# Patient Record
Sex: Female | Born: 1975 | Race: White | Hispanic: No | Marital: Married | Smoking: Never smoker
Health system: Southern US, Community
[De-identification: ages and names within clinical notes are randomized; demographics above are authoritative.]

## PROBLEM LIST (undated history)

## (undated) DIAGNOSIS — F419 Anxiety disorder, unspecified: Secondary | ICD-10-CM

## (undated) DIAGNOSIS — E561 Deficiency of vitamin K: Secondary | ICD-10-CM

## (undated) DIAGNOSIS — T7840XA Allergy, unspecified, initial encounter: Secondary | ICD-10-CM

## (undated) DIAGNOSIS — E538 Deficiency of other specified B group vitamins: Secondary | ICD-10-CM

## (undated) DIAGNOSIS — E559 Vitamin D deficiency, unspecified: Secondary | ICD-10-CM

## (undated) DIAGNOSIS — M1990 Unspecified osteoarthritis, unspecified site: Secondary | ICD-10-CM

## (undated) DIAGNOSIS — K589 Irritable bowel syndrome without diarrhea: Secondary | ICD-10-CM

## (undated) DIAGNOSIS — I10 Essential (primary) hypertension: Secondary | ICD-10-CM

## (undated) HISTORY — DX: Irritable bowel syndrome, unspecified: K58.9

## (undated) HISTORY — DX: Unspecified osteoarthritis, unspecified site: M19.90

## (undated) HISTORY — DX: Allergy, unspecified, initial encounter: T78.40XA

## (undated) HISTORY — PX: MIDDLE EAR SURGERY: SHX713

## (undated) HISTORY — DX: Deficiency of other specified B group vitamins: E53.8

## (undated) HISTORY — DX: Essential (primary) hypertension: I10

## (undated) HISTORY — DX: Anxiety disorder, unspecified: F41.9

## (undated) HISTORY — DX: Deficiency of vitamin K: E56.1

## (undated) HISTORY — DX: Vitamin D deficiency, unspecified: E55.9

## (undated) HISTORY — PX: PYLOROPLASTY: SHX418

---
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
## 2001-06-10 ENCOUNTER — Other Ambulatory Visit: Admission: RE | Admit: 2001-06-10 | Discharge: 2001-06-10 | Payer: Self-pay | Admitting: Obstetrics & Gynecology

## 2002-07-16 ENCOUNTER — Other Ambulatory Visit: Admission: RE | Admit: 2002-07-16 | Discharge: 2002-07-16 | Payer: Self-pay | Admitting: Obstetrics & Gynecology

## 2003-04-10 ENCOUNTER — Inpatient Hospital Stay (HOSPITAL_COMMUNITY): Admission: AD | Admit: 2003-04-10 | Discharge: 2003-04-12 | Payer: Self-pay | Admitting: Obstetrics & Gynecology

## 2003-04-13 ENCOUNTER — Encounter: Admission: RE | Admit: 2003-04-13 | Discharge: 2003-05-13 | Payer: Self-pay | Admitting: Obstetrics & Gynecology

## 2003-05-12 ENCOUNTER — Other Ambulatory Visit: Admission: RE | Admit: 2003-05-12 | Discharge: 2003-05-12 | Payer: Self-pay | Admitting: Obstetrics & Gynecology

## 2007-07-07 ENCOUNTER — Inpatient Hospital Stay (HOSPITAL_COMMUNITY): Admission: AD | Admit: 2007-07-07 | Discharge: 2007-07-09 | Payer: Self-pay | Admitting: Obstetrics & Gynecology

## 2010-04-04 ENCOUNTER — Inpatient Hospital Stay (HOSPITAL_COMMUNITY): Admission: AD | Admit: 2010-04-04 | Discharge: 2010-04-04 | Payer: Self-pay | Admitting: Obstetrics & Gynecology

## 2010-04-04 ENCOUNTER — Ambulatory Visit: Payer: Self-pay | Admitting: Advanced Practice Midwife

## 2010-04-05 ENCOUNTER — Ambulatory Visit (HOSPITAL_COMMUNITY): Admission: RE | Admit: 2010-04-05 | Discharge: 2010-04-05 | Payer: Self-pay | Admitting: Obstetrics & Gynecology

## 2010-04-05 ENCOUNTER — Ambulatory Visit: Payer: Self-pay | Admitting: Vascular Surgery

## 2010-04-05 ENCOUNTER — Encounter (INDEPENDENT_AMBULATORY_CARE_PROVIDER_SITE_OTHER): Payer: Self-pay | Admitting: Obstetrics & Gynecology

## 2010-08-14 ENCOUNTER — Inpatient Hospital Stay (HOSPITAL_COMMUNITY)
Admission: AD | Admit: 2010-08-14 | Discharge: 2010-08-16 | Payer: Self-pay | Source: Home / Self Care | Attending: Obstetrics and Gynecology | Admitting: Obstetrics and Gynecology

## 2010-08-17 ENCOUNTER — Inpatient Hospital Stay (HOSPITAL_COMMUNITY): Admission: AD | Admit: 2010-08-17 | Payer: Self-pay | Source: Home / Self Care | Admitting: Obstetrics and Gynecology

## 2010-10-29 LAB — CBC
MCH: 27 pg (ref 26.0–34.0)
MCH: 27.5 pg (ref 26.0–34.0)
Platelets: 197 10*3/uL (ref 150–400)
Platelets: 237 10*3/uL (ref 150–400)
RBC: 4.04 MIL/uL (ref 3.87–5.11)
RBC: 4.32 MIL/uL (ref 3.87–5.11)
WBC: 12.6 10*3/uL — ABNORMAL HIGH (ref 4.0–10.5)

## 2010-10-29 LAB — ABO/RH: ABO/RH(D): O POS

## 2010-10-29 LAB — RPR: RPR Ser Ql: NONREACTIVE

## 2011-01-04 NOTE — Op Note (Signed)
   NAME:  Amber Frank, Amber Frank                        ACCOUNT NO.:  [id number]   MEDICAL RECORD NO.:  [id number]                   PATIENT TYPE:  INP   LOCATION:  9141                                 FACILITY:  [facility]   PHYSICIAN:  Lenoard Aden, M.D.             DATE OF BIRTH:  03/15/1976   DATE OF PROCEDURE:  04/10/2003  DATE OF DISCHARGE:                                 OPERATIVE REPORT   INDICATIONS FOR OPERATIVE DELIVERY:  Fetal tachycardia with nonreassuring  tracing.   POSTOPERATIVE DIAGNOSIS:  Fetal tachycardia with nonreassuring tracing.   PROCEDURE:  Outlet vacuum-assisted vaginal delivery, three-pull Kiwi cup,  one popoff.   SURGEON:  Lenoard Aden, M.D.   ANESTHESIA:  Epidural.   ESTIMATED BLOOD LOSS:  500 cc.   COMPLICATIONS:  None.   DRAINS:  None.   DISPOSITION:  The patient to recovery room in good condition.   DESCRIPTION OF PROCEDURE:  After being apprised of the risks and benefits of  vacuum assistance, which included small incidence of cephalohematoma, scalp  laceration and intracranial hemorrhage, the Kiwi cup was placed (after the  removal of the Foley catheter).  Fetal vertex at LOA less than 45 degrees,  plus 3 station.  Kiwi cup placed in the proper location for three pulls,  with one popoff of a full-term living female over a second-degree midline  laceration.  Agars 9 and 9.  Cord blood collected.  Cord pH collected.  Placenta delivered spontaneously intact.  Bulb suctioning done.   Repair done using a 3-0 repeat in the standard fashion.  No vaginal or  surgical lacerations are noted.  There is a small vulvar hematoma on the  left, which appears stable and has no evidence of expansion on rectal or  vaginal examination.   The patient tolerated the procedure well; mother and baby to recovery room  in good condition.                                               Lenoard Aden, M.D.    RJT/MEDQ  D:  04/10/2003  T:  04/10/2003  Job:   [id number]

## 2011-01-04 NOTE — H&P (Signed)
   NAME:  Amber Frank, Amber Frank                        ACCOUNT NO.:  [id number]   MEDICAL RECORD NO.:  [id number]                   PATIENT TYPE:  INP   LOCATION:  9171                                 FACILITY:  [facility]   PHYSICIAN:  Lenoard Aden, M.D.             DATE OF BIRTH:  12/15/1975   DATE OF ADMISSION:  04/10/2003  DATE OF DISCHARGE:                                HISTORY & PHYSICAL   CHIEF COMPLAINT:  Spontaneous rupture of membranes at 0245 on April 10, 2003.   HISTORY OF PRESENT ILLNESS:  The patient is a 35 year old white female, G2,  P0, EDD of April 12, 2003, at 39+ weeks with unexplained AFP elevation  scheduled for induction today but presented with spontaneous rupture of  membranes this a.m., as noted.   PAST OBSTETRIC HISTORY:  TAB in 1996.   PAST MEDICAL HISTORY:  1. Migraine headaches.  2. Hernia repair.  3. Middle ear reconstruction.  4. Tonsillectomy.  5. Wisdom tooth removal.   FAMILY HISTORY:  Thyroid disease and diabetes.   PRENATAL LABORATORY DATA:  Blood type O positive.  Rh antibody negative.  Rubella immune.  Hepatitis and HIV negative.   PHYSICAL EXAMINATION:  GENERAL:  She is a well-developed, well-nourished,  white female in no acute distress.  HEENT:  Normal.  LUNGS:  Clear.  HEART:  Regular rhythm.  ABDOMEN:  Soft, gravid, and nontender.  Estimated fetal weight 7.5 9 pounds.  PELVIC:  Cervix is 4-5 cm, 90% vertex, 0, slightly posterior.  EXTREMITIES:  Feel no cords.  NEUROLOGIC:  Nonfocal.   IMPRESSION:  1. A 39 week intrauterine pregnancy.  2. Unexplained AFP elevation.  3. Spontaneous rupture of membranes with active labor.    PLAN:  1. Proceed with Pitocin augmentation as needed.  2. Anticipate attempts at vaginal delivery.                                               Lenoard Aden, M.D.    RJT/MEDQ  D:  04/10/2003  T:  04/10/2003  Job:  [id number]

## 2011-05-22 ENCOUNTER — Other Ambulatory Visit: Payer: Self-pay | Admitting: Sports Medicine

## 2011-05-22 DIAGNOSIS — S161XXA Strain of muscle, fascia and tendon at neck level, initial encounter: Secondary | ICD-10-CM

## 2011-05-26 ENCOUNTER — Other Ambulatory Visit: Payer: Self-pay

## 2011-05-28 LAB — CBC
HCT: 33.4 — ABNORMAL LOW
HCT: 35.5 — ABNORMAL LOW
Hemoglobin: 11.3 — ABNORMAL LOW
Hemoglobin: 12.3
MCHC: 33.8
MCHC: 34.6
Platelets: 191
Platelets: 219
RBC: 3.78 — ABNORMAL LOW
RBC: 4.08
WBC: 13 — ABNORMAL HIGH

## 2011-05-28 LAB — RPR: RPR Ser Ql: NONREACTIVE

## 2012-05-26 ENCOUNTER — Ambulatory Visit (INDEPENDENT_AMBULATORY_CARE_PROVIDER_SITE_OTHER): Payer: BC Managed Care – PPO | Admitting: Family Medicine

## 2012-05-26 VITALS — BP 126/80 | HR 86 | Temp 98.7°F | Resp 16 | Ht 65.0 in | Wt 141.0 lb

## 2012-05-26 DIAGNOSIS — M546 Pain in thoracic spine: Secondary | ICD-10-CM

## 2012-05-26 DIAGNOSIS — M549 Dorsalgia, unspecified: Secondary | ICD-10-CM

## 2012-05-26 DIAGNOSIS — S29012A Strain of muscle and tendon of back wall of thorax, initial encounter: Secondary | ICD-10-CM

## 2012-05-26 MED ORDER — DICLOFENAC SODIUM 75 MG PO TBEC: 75.0000 mg | DELAYED_RELEASE_TABLET | Freq: Two times a day (BID) | ORAL | Status: DC

## 2012-05-26 MED ORDER — METAXALONE 800 MG PO TABS: ORAL_TABLET | ORAL | Status: DC

## 2012-05-26 MED ORDER — HYDROCODONE-ACETAMINOPHEN 5-500 MG PO TABS: 1.0000 | ORAL_TABLET | ORAL | Status: DC | PRN

## 2012-05-26 NOTE — Progress Notes (Signed)
Subjective: Patient knows of no injury, but over the past couple days been having severe pain just under the age of the left scapula. Cannot sleep at night. 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 She has an 54-month-old child at home.  Objective: Chest clear. The shortness of breath is just from difficulty breathing he didn't eat because it hurts. Her heart was regular without murmurs. She is tender just under the medial edge of the left scapula. Strength is good. Range of motion is fair.  Assessment: Rhomboid strain  Plan: Muscle relaxants, pain medication, and anti-inflammatory medication. If not doing better may need physical therapy.

## 2012-05-26 NOTE — Patient Instructions (Signed)
Use medicines as directed  Ice/heat  Return if worse

## 2014-06-07 ENCOUNTER — Other Ambulatory Visit: Payer: Self-pay | Admitting: Family Medicine

## 2014-06-07 DIAGNOSIS — N6320 Unspecified lump in the left breast, unspecified quadrant: Secondary | ICD-10-CM

## 2014-06-17 ENCOUNTER — Ambulatory Visit
Admission: RE | Admit: 2014-06-17 | Discharge: 2014-06-17 | Disposition: A | Discharge status: Home / Self Care | Payer: BC Managed Care – PPO | Source: Ambulatory Visit | Attending: Family Medicine | Admitting: Family Medicine

## 2014-06-17 DIAGNOSIS — N6320 Unspecified lump in the left breast, unspecified quadrant: Secondary | ICD-10-CM

## 2014-12-12 ENCOUNTER — Ambulatory Visit (INDEPENDENT_AMBULATORY_CARE_PROVIDER_SITE_OTHER): Payer: BC Managed Care – PPO | Admitting: Neurology

## 2014-12-12 ENCOUNTER — Encounter: Payer: Self-pay | Admitting: Neurology

## 2014-12-12 ENCOUNTER — Ambulatory Visit (INDEPENDENT_AMBULATORY_CARE_PROVIDER_SITE_OTHER): Payer: Self-pay | Admitting: Neurology

## 2014-12-12 DIAGNOSIS — R202 Paresthesia of skin: Secondary | ICD-10-CM

## 2014-12-12 DIAGNOSIS — M79602 Pain in left arm: Principal | ICD-10-CM

## 2014-12-12 DIAGNOSIS — M79603 Pain in arm, unspecified: Secondary | ICD-10-CM

## 2014-12-12 DIAGNOSIS — M79601 Pain in right arm: Secondary | ICD-10-CM

## 2014-12-12 NOTE — Procedures (Signed)
     HISTORY:  Amber Frank is a 39 year old patient with a two-year history of paresthesias of the hands associated with pain. She will have numbness at night in both hands, right greater than left. She does report some mild neck discomfort. She is being evaluated for a possible neuropathy or cervical radiculopathy.  NERVE CONDUCTION STUDIES:  Nerve conduction studies were performed on both upper extremities. The distal motor latencies and motor amplitudes for the median and ulnar nerves were within normal limits. The F wave latencies and nerve conduction velocities for these nerves were also normal. The sensory latencies for the median and ulnar nerves were normal.   EMG STUDIES:  EMG study was performed on the right upper extremity:  The first dorsal interosseous muscle reveals 2 to 4 K units with full recruitment. No fibrillations or positive waves were noted. The abductor pollicis brevis muscle reveals 2 to 4 K units with full recruitment. No fibrillations or positive waves were noted. The extensor indicis proprius muscle reveals 1 to 3 K units with full recruitment. No fibrillations or positive waves were noted. The pronator teres muscle reveals 2 to 3 K units with full recruitment. No fibrillations or positive waves were noted. The biceps muscle reveals 1 to 2 K units with full recruitment. No fibrillations or positive waves were noted. The triceps muscle reveals 2 to 4 K units with full recruitment. No fibrillations or positive waves were noted. The anterior deltoid muscle reveals 2 to 3 K units with full recruitment. No fibrillations or positive waves were noted. The cervical paraspinal muscles were tested at 2 levels. No abnormalities of insertional activity were seen at either level tested. There was good relaxation.  EMG study was performed on the left upper extremity:  The first dorsal interosseous muscle reveals 2 to 4 K units with full recruitment. No fibrillations or  positive waves were noted. The abductor pollicis brevis muscle reveals 2 to 4 K units with full recruitment. No fibrillations or positive waves were noted. The extensor indicis proprius muscle reveals 1 to 3 K units with full recruitment. No fibrillations or positive waves were noted. The pronator teres muscle reveals 2 to 3 K units with full recruitment. No fibrillations or positive waves were noted. The biceps muscle reveals 1 to 2 K units with full recruitment. No fibrillations or positive waves were noted. The triceps muscle reveals 2 to 4 K units with full recruitment. No fibrillations or positive waves were noted. The anterior deltoid muscle reveals 2 to 3 K units with full recruitment. No fibrillations or positive waves were noted. The cervical paraspinal muscles were not tested.   IMPRESSION:  Nerve conduction studies done on both upper extremities were within normal limits. No evidence of carpal tunnel syndrome is seen on either side. EMG evaluation of both upper extremities is unremarkable, without evidence of an overlying cervical radiculopathy on either side.  Jill Alexanders MD 12/12/2014 10:53 AM  [facility] [address] [place], [place] [address]  Phone [phone] Fax [phone]

## 2014-12-12 NOTE — Progress Notes (Signed)
Please refer to EMG and nerve conduction study procedure note. 

## 2016-02-29 ENCOUNTER — Ambulatory Visit
Admission: RE | Admit: 2016-02-29 | Discharge: 2016-02-29 | Disposition: A | Discharge status: Home / Self Care | Payer: BC Managed Care – PPO | Source: Ambulatory Visit | Attending: Family Medicine | Admitting: Family Medicine

## 2016-02-29 ENCOUNTER — Other Ambulatory Visit: Payer: Self-pay | Admitting: Family Medicine

## 2016-02-29 DIAGNOSIS — M5412 Radiculopathy, cervical region: Secondary | ICD-10-CM

## 2016-06-26 ENCOUNTER — Encounter: Payer: Self-pay | Admitting: Gastroenterology

## 2016-07-08 ENCOUNTER — Encounter: Payer: Self-pay | Admitting: Gastroenterology

## 2016-07-08 ENCOUNTER — Ambulatory Visit (INDEPENDENT_AMBULATORY_CARE_PROVIDER_SITE_OTHER): Payer: BC Managed Care – PPO | Admitting: Gastroenterology

## 2016-07-08 VITALS — BP 120/60 | HR 78 | Ht 65.0 in | Wt 185.0 lb

## 2016-07-08 DIAGNOSIS — K625 Hemorrhage of anus and rectum: Secondary | ICD-10-CM

## 2016-07-08 DIAGNOSIS — K529 Noninfective gastroenteritis and colitis, unspecified: Secondary | ICD-10-CM

## 2016-07-08 DIAGNOSIS — Z800 Family history of malignant neoplasm of digestive organs: Secondary | ICD-10-CM

## 2016-07-08 NOTE — Patient Instructions (Signed)

## 2016-07-08 NOTE — Progress Notes (Addendum)
07/08/2016 Amber Frank [id number] 08-20-1975   HISTORY OF PRESENT ILLNESS:  This is a pleasant 40 year old female who is new to our practice.  Says that she's had diarrhea for several years but it seems to have worsened over time.  Says that she if she sticks to a strict diet then she does much better.  Says that the only things that she can eat are rice, meat, cheese, and breads.  Almost all vegetables and fruits give her diarrhea.  Says that when she strays from that diet then she has urgent diarrhea sometimes 10 times per day.  Has had incontinence.  Has seen moderate amounts of blood in her stool at times as well.  No abdominal pain/cramping between episodes of diarrhea or when she is following her diet restrictions.  Was given a diagnosis of IBS-D by her PCP.  She says that she is fine with that diagnosis and will manage and deal with it but wants to make sure that there is not something else causing her symptoms.  She has a family history of Crohn's disease in an aunt and cousin.  Also her father was diagnosed with and died from colon cancer at age 33.  Says that her PCP check her thyroid and celiac labs (says that celiac labs were negative) so we will get those records.  Referred here by her PCP, Dr. Darron Doom.   Past Medical History:  Diagnosis Date  . Anxiety   . Hypertension   . IBS (irritable bowel syndrome)    with diarrhea  . Vitamin B12 deficiency   . Vitamin D deficiency   . Vitamin K deficiency    Past Surgical History:  Procedure Laterality Date  . [place]    reports that she has never smoked. She has never used smokeless tobacco. She reports that she drinks alcohol. She reports that she does not use drugs. family history is not on file. Allergies  Allergen Reactions  . Erythromycin Nausea And Vomiting    Severe stomach cramps.  . Other     Nuts- throat swells up  . Zyrtec [Cetirizine] Other (See Comments)    Makes her feel "loopy"  .  Penicillins Rash      Outpatient Encounter Prescriptions as of 07/08/2016  Medication Sig  . escitalopram (LEXAPRO) 10 MG tablet Take 10 mg by mouth daily. 10-15 mg every day  . lisinopril-hydrochlorothiazide (PRINZIDE,ZESTORETIC) 10-12.5 MG tablet Take 1 tablet by mouth daily.  . mometasone (NASONEX) 50 MCG/ACT nasal spray Place 2 sprays into the nose as needed.  . [DISCONTINUED] cetirizine (ZYRTEC) 10 MG tablet Take 10 mg by mouth as needed.  . [DISCONTINUED] diclofenac (VOLTAREN) 75 MG EC tablet Take 1 tablet (75 mg total) by mouth 2 (two) times daily.  . [DISCONTINUED] HYDROcodone-acetaminophen (VICODIN) 5-500 MG per tablet Take 1 tablet by mouth every 4 (four) hours as needed for pain.  . [DISCONTINUED] metaxalone (SKELAXIN) 800 MG tablet Use 3 times daily as needed for muscle relaxant   No facility-administered encounter medications on file as of 07/08/2016.      REVIEW OF SYSTEMS  : All other systems reviewed and negative except where noted in the History of Present Illness.   PHYSICAL EXAM: BP 120/60   Pulse 78   Ht 5\' 5"  (1.651 m)   Wt 185 lb (83.9 kg)   BMI 30.79 kg/m  General: Well developed white female in no acute distress Head: Normocephalic and atraumatic Eyes:  Sclerae anicteric, conjunctiva pink. Ears: Normal auditory acuity Lungs: Clear throughout to auscultation Heart: Regular rate and rhythm Abdomen: Soft, non-distended.  Normal bowel sounds.  Non-tender. Rectal:  Will be done at the time of colonoscopy. Musculoskeletal: Symmetrical with no gross deformities  Skin: No lesions on visible extremities Extremities: No edema  Neurological: Alert oriented x 4, grossly non-focal Psychological:  Alert and cooperative. Normal mood and affect  ASSESSMENT AND PLAN: -40 year old female with worsening diarrhea over the years, presumed IBS-D, but also rectal bleeding as well. -Family history of colon cancer in her father who was diagnosed and passed away at age  24.  *Will schedule for colonoscopy with Dr. Hilarie Fredrickson.  The risks, benefits, and alternatives to colonoscopy were discussed with the patient and she consents to proceed.  *If colonoscopy is negative then would consider Viberzi 100 mg BID. *Will get lab records from her PCP.  CC:  Hayden Rasmussen, MD   Addendum: Reviewed and agree with initial management. Jerene Bears, MD

## 2016-07-09 ENCOUNTER — Encounter: Payer: Self-pay | Admitting: Internal Medicine

## 2016-07-09 ENCOUNTER — Ambulatory Visit (AMBULATORY_SURGERY_CENTER): Payer: BC Managed Care – PPO | Admitting: Internal Medicine

## 2016-07-09 VITALS — BP 124/74 | HR 67 | Temp 95.5°F | Resp 9 | Ht 65.0 in | Wt 185.0 lb

## 2016-07-09 DIAGNOSIS — D125 Benign neoplasm of sigmoid colon: Secondary | ICD-10-CM

## 2016-07-09 DIAGNOSIS — K635 Polyp of colon: Secondary | ICD-10-CM

## 2016-07-09 DIAGNOSIS — K529 Noninfective gastroenteritis and colitis, unspecified: Secondary | ICD-10-CM

## 2016-07-09 DIAGNOSIS — Z800 Family history of malignant neoplasm of digestive organs: Secondary | ICD-10-CM

## 2016-07-09 DIAGNOSIS — D120 Benign neoplasm of cecum: Secondary | ICD-10-CM

## 2016-07-09 MED ORDER — SODIUM CHLORIDE 0.9 % IV SOLN: 500.0000 mL | INTRAVENOUS | Status: DC

## 2016-07-09 NOTE — Progress Notes (Signed)
Called to room to assist during endoscopic procedure.  Patient ID and intended procedure confirmed with present staff. Received instructions for my participation in the procedure from the performing physician.  

## 2016-07-09 NOTE — Patient Instructions (Signed)
YOU HAD AN ENDOSCOPIC PROCEDURE TODAY AT THE [facility]:   Refer to the procedure report that was given to you for any specific questions about what was found during the examination.  If the procedure report does not answer your questions, please call your gastroenterologist to clarify.  If you requested that your care partner not be given the details of your procedure findings, then the procedure report has been included in a sealed envelope for you to review at your convenience later.  YOU SHOULD EXPECT: Some feelings of bloating in the abdomen. Passage of more gas than usual.  Walking can help get rid of the air that was put into your GI tract during the procedure and reduce the bloating. If you had a lower endoscopy (such as a colonoscopy or flexible sigmoidoscopy) you may notice spotting of blood in your stool or on the toilet paper. If you underwent a bowel prep for your procedure, you may not have a normal bowel movement for a few days.  Please Note:  You might notice some irritation and congestion in your nose or some drainage.  This is from the oxygen used during your procedure.  There is no need for concern and it should clear up in a day or so.  SYMPTOMS TO REPORT IMMEDIATELY:   Following lower endoscopy (colonoscopy or flexible sigmoidoscopy):  Excessive amounts of blood in the stool  Significant tenderness or worsening of abdominal pains  Swelling of the abdomen that is new, acute  Fever of 100F or higher    For urgent or emergent issues, a gastroenterologist can be reached at any hour by calling [phone].   DIET:  We do recommend a small meal at first, but then you may proceed to your regular diet.  Drink plenty of fluids but you should avoid alcoholic beverages for 24 hours.  ACTIVITY:  You should plan to take it easy for the rest of today and you should NOT DRIVE or use heavy machinery until tomorrow (because of the sedation medicines used during the test).     FOLLOW UP: Our staff will call the number listed on your records the next business day following your procedure to check on you and address any questions or concerns that you may have regarding the information given to you following your procedure. If we do not reach you, we will leave a message.  However, if you are feeling well and you are not experiencing any problems, there is no need to return our call.  We will assume that you have returned to your regular daily activities without incident.  If any biopsies were taken you will be contacted by phone or by letter within the next 1-3 weeks.  Please call us at [phone] if you have not heard about the biopsies in 3 weeks.    SIGNATURES/CONFIDENTIALITY: You and/or your care partner have signed paperwork which will be entered into your electronic medical record.  These signatures attest to the fact that that the information above on your After Visit Summary has been reviewed and is understood.  Full responsibility of the confidentiality of this discharge information lies with you and/or your care-partner  Polyp and hemorrhoid information given.. 

## 2016-07-09 NOTE — Op Note (Signed)
[facility] Patient Name: Amber Frank Procedure Date: 07/09/2016 8:54 AM MRN: [id number] Endoscopist: Jerene Bears , MD Age: 40 Referring MD:  Date of Birth: May 14, 1976 Gender: Female Account #: [id number] Procedure:                Colonoscopy Indications:              Chronic diarrhea, family history of colon cancer in                            father at age 64, intermittent rectal bleeding Medicines:                Monitored Anesthesia Care Procedure:                Pre-Anesthesia Assessment:                           - Prior to the procedure, a History and Physical                            was performed, and patient medications and                            allergies were reviewed. The patient's tolerance of                            previous anesthesia was also reviewed. The risks                            and benefits of the procedure and the sedation                            options and risks were discussed with the patient.                            All questions were answered, and informed consent                            was obtained. Prior Anticoagulants: The patient has                            taken no previous anticoagulant or antiplatelet                            agents. ASA Grade Assessment: II - A patient with                            mild systemic disease. After reviewing the risks                            and benefits, the patient was deemed in                            satisfactory condition to undergo the procedure.  After obtaining informed consent, the colonoscope                            was passed under direct vision. Throughout the                            procedure, the patient's blood pressure, pulse, and                            oxygen saturations were monitored continuously. The                            Model PCF-H190DL [id number]) scope was introduced                            through  the anus and advanced to the the terminal                            ileum. The colonoscopy was performed without                            difficulty. The patient tolerated the procedure                            well. The quality of the bowel preparation was                            excellent. The terminal ileum, ileocecal valve,                            appendiceal orifice, and rectum were photographed. Scope In: 9:04:34 AM Scope Out: 9:18:12 AM Scope Withdrawal Time: 0 hours 11 minutes 22 seconds  Total Procedure Duration: 0 hours 13 minutes 38 seconds  Findings:                 The perianal and digital rectal examinations were                            normal.                           The terminal ileum appeared normal.                           A 5 mm polyp was found in the cecum. The polyp was                            sessile. The polyp was removed with a cold snare.                            Resection and retrieval were complete.                           A 4 mm polyp was found in the sigmoid colon. The  polyp was sessile. The polyp was removed with a                            cold snare. Resection and retrieval were complete.                           Normal mucosa was found in the entire colon (other                            than the previously mentioned polyps). Biopsies for                            histology were taken with a cold forceps from the                            right colon and left colon for evaluation of                            microscopic colitis.                           Internal hemorrhoids were found during                            retroflexion. The hemorrhoids were small. Complications:            No immediate complications. Estimated Blood Loss:     Estimated blood loss was minimal. Impression:               - The examined portion of the ileum was normal.                           - One 5 mm polyp in the  cecum, removed with a cold                            snare. Resected and retrieved.                           - One 4 mm polyp in the sigmoid colon, removed with                            a cold snare. Resected and retrieved.                           - Otherwise normal mucosa in the entire examined                            colon. Biopsied.                           - Small internal hemorrhoids. Recommendation:           - Patient has a contact number available for  emergencies. The signs and symptoms of potential                            delayed complications were discussed with the                            patient. Return to normal activities tomorrow.                            Written discharge instructions were provided to the                            patient.                           - Resume previous diet.                           - Continue present medications.                           - Await pathology results.                           - Repeat colonoscopy is recommended. The                            colonoscopy date will be determined after pathology                            results from today's exam become available for                            review. Jerene Bears, MD 07/09/2016 9:23:31 AM This report has been signed electronically.

## 2016-07-10 ENCOUNTER — Telehealth: Payer: Self-pay | Admitting: *Deleted

## 2016-07-10 ENCOUNTER — Telehealth: Payer: Self-pay

## 2016-07-10 NOTE — Telephone Encounter (Signed)
No answer, message left for the patient. 

## 2016-07-10 NOTE — Telephone Encounter (Signed)
  Follow up Call-  Call back number 07/09/2016  Post procedure Call Back phone  # [phone]  Permission to leave phone message Yes  Some recent data might be hidden     Patient questions:  Do you have a fever, pain , or abdominal swelling? No. Pain Score  0 *  Have you tolerated food without any problems? Yes.    Have you been able to return to your normal activities? Yes.    Do you have any questions about your discharge instructions: Diet   No. Medications  No. Follow up visit  No.  Do you have questions or concerns about your Care? No.  Actions: * If pain score is 4 or above: No action needed, pain <4.

## 2016-07-16 ENCOUNTER — Encounter: Payer: Self-pay | Admitting: Internal Medicine

## 2017-08-22 ENCOUNTER — Ambulatory Visit
Admission: RE | Admit: 2017-08-22 | Discharge: 2017-08-22 | Disposition: A | Discharge status: Home / Self Care | Payer: BC Managed Care – PPO | Source: Ambulatory Visit | Attending: Family Medicine | Admitting: Family Medicine

## 2017-08-22 ENCOUNTER — Other Ambulatory Visit: Payer: Self-pay | Admitting: Family Medicine

## 2017-08-22 DIAGNOSIS — M542 Cervicalgia: Secondary | ICD-10-CM

## 2018-06-25 ENCOUNTER — Other Ambulatory Visit: Payer: Self-pay | Admitting: Family Medicine

## 2018-06-25 DIAGNOSIS — Z1231 Encounter for screening mammogram for malignant neoplasm of breast: Secondary | ICD-10-CM

## 2018-07-07 ENCOUNTER — Other Ambulatory Visit: Payer: Self-pay | Admitting: Physician Assistant

## 2018-07-07 DIAGNOSIS — H9312 Tinnitus, left ear: Secondary | ICD-10-CM

## 2018-07-07 DIAGNOSIS — H905 Unspecified sensorineural hearing loss: Secondary | ICD-10-CM

## 2018-07-07 DIAGNOSIS — H903 Sensorineural hearing loss, bilateral: Secondary | ICD-10-CM

## 2018-07-07 DIAGNOSIS — R42 Dizziness and giddiness: Secondary | ICD-10-CM

## 2018-07-27 ENCOUNTER — Ambulatory Visit
Admission: RE | Admit: 2018-07-27 | Discharge: 2018-07-27 | Disposition: A | Discharge status: Home / Self Care | Payer: BC Managed Care – PPO | Source: Ambulatory Visit | Attending: Physician Assistant | Admitting: Physician Assistant

## 2018-07-27 DIAGNOSIS — R42 Dizziness and giddiness: Secondary | ICD-10-CM

## 2018-07-27 DIAGNOSIS — H903 Sensorineural hearing loss, bilateral: Secondary | ICD-10-CM

## 2018-07-27 DIAGNOSIS — H9312 Tinnitus, left ear: Secondary | ICD-10-CM

## 2018-07-27 DIAGNOSIS — H905 Unspecified sensorineural hearing loss: Secondary | ICD-10-CM

## 2018-07-27 MED ORDER — GADOBENATE DIMEGLUMINE 529 MG/ML IV SOLN
17.0000 mL | Freq: Once | INTRAVENOUS | Status: AC | PRN
  Administered 2018-07-27: 17 mL via INTRAVENOUS

## 2018-09-29 ENCOUNTER — Ambulatory Visit
Admission: RE | Admit: 2018-09-29 | Discharge: 2018-09-29 | Disposition: A | Discharge status: Home / Self Care | Payer: BC Managed Care – PPO | Source: Ambulatory Visit | Attending: Family Medicine | Admitting: Family Medicine

## 2018-09-29 DIAGNOSIS — Z1231 Encounter for screening mammogram for malignant neoplasm of breast: Secondary | ICD-10-CM

## 2019-10-07 ENCOUNTER — Other Ambulatory Visit: Payer: Self-pay | Admitting: Family Medicine

## 2019-10-07 DIAGNOSIS — Z1231 Encounter for screening mammogram for malignant neoplasm of breast: Secondary | ICD-10-CM

## 2019-10-11 ENCOUNTER — Other Ambulatory Visit: Payer: Self-pay

## 2019-10-11 ENCOUNTER — Ambulatory Visit
Admission: RE | Admit: 2019-10-11 | Discharge: 2019-10-11 | Disposition: A | Discharge status: Home / Self Care | Payer: BC Managed Care – PPO | Source: Ambulatory Visit

## 2019-10-11 DIAGNOSIS — Z1231 Encounter for screening mammogram for malignant neoplasm of breast: Secondary | ICD-10-CM

## 2020-10-11 DIAGNOSIS — Z1231 Encounter for screening mammogram for malignant neoplasm of breast: Secondary | ICD-10-CM

## 2020-10-13 ENCOUNTER — Ambulatory Visit (HOSPITAL_COMMUNITY)
Admission: RE | Admit: 2020-10-13 | Discharge: 2020-10-13 | Disposition: A | Discharge status: Home / Self Care | Payer: BC Managed Care – PPO | Source: Ambulatory Visit | Attending: Family Medicine | Admitting: Family Medicine

## 2020-10-13 ENCOUNTER — Other Ambulatory Visit: Payer: Self-pay

## 2020-10-13 ENCOUNTER — Other Ambulatory Visit (HOSPITAL_COMMUNITY): Payer: Self-pay | Admitting: Family Medicine

## 2020-10-13 ENCOUNTER — Encounter (HOSPITAL_COMMUNITY): Payer: Self-pay

## 2020-10-13 DIAGNOSIS — Z1231 Encounter for screening mammogram for malignant neoplasm of breast: Secondary | ICD-10-CM

## 2021-05-01 ENCOUNTER — Encounter: Payer: Self-pay | Admitting: Internal Medicine

## 2021-06-29 ENCOUNTER — Encounter: Payer: BC Managed Care – PPO | Admitting: Internal Medicine

## 2021-07-09 ENCOUNTER — Encounter: Payer: BC Managed Care – PPO | Admitting: Internal Medicine

## 2021-07-11 ENCOUNTER — Encounter: Payer: Self-pay | Admitting: Internal Medicine

## 2021-09-12 ENCOUNTER — Other Ambulatory Visit (HOSPITAL_COMMUNITY): Payer: Self-pay | Admitting: Family Medicine

## 2021-09-12 DIAGNOSIS — Z1231 Encounter for screening mammogram for malignant neoplasm of breast: Secondary | ICD-10-CM

## 2021-09-14 ENCOUNTER — Ambulatory Visit (AMBULATORY_SURGERY_CENTER): Payer: BC Managed Care – PPO | Admitting: *Deleted

## 2021-09-14 ENCOUNTER — Other Ambulatory Visit: Payer: Self-pay

## 2021-09-14 VITALS — Ht 65.0 in | Wt 180.0 lb

## 2021-09-14 DIAGNOSIS — Z800 Family history of malignant neoplasm of digestive organs: Secondary | ICD-10-CM

## 2021-09-14 DIAGNOSIS — Z86010 Personal history of colonic polyps: Secondary | ICD-10-CM

## 2021-09-14 MED ORDER — NA SULFATE-K SULFATE-MG SULF 17.5-3.13-1.6 GM/177ML PO SOLN: 1.0000 | Freq: Once | ORAL | 0 refills | Status: AC

## 2021-09-14 NOTE — Progress Notes (Signed)

## 2021-09-25 ENCOUNTER — Encounter: Payer: Self-pay | Admitting: Internal Medicine

## 2021-09-28 ENCOUNTER — Ambulatory Visit (AMBULATORY_SURGERY_CENTER): Payer: BC Managed Care – PPO | Admitting: Internal Medicine

## 2021-09-28 ENCOUNTER — Encounter: Payer: Self-pay | Admitting: Internal Medicine

## 2021-09-28 ENCOUNTER — Other Ambulatory Visit: Payer: Self-pay

## 2021-09-28 VITALS — BP 129/72 | HR 68 | Temp 97.5°F | Resp 12 | Ht 65.0 in | Wt 180.0 lb

## 2021-09-28 DIAGNOSIS — Z86010 Personal history of colonic polyps: Secondary | ICD-10-CM

## 2021-09-28 DIAGNOSIS — Z800 Family history of malignant neoplasm of digestive organs: Secondary | ICD-10-CM | POA: Diagnosis not present

## 2021-09-28 DIAGNOSIS — D125 Benign neoplasm of sigmoid colon: Secondary | ICD-10-CM

## 2021-09-28 MED ORDER — SODIUM CHLORIDE 0.9 % IV SOLN: 500.0000 mL | Freq: Once | INTRAVENOUS | Status: DC

## 2021-09-28 NOTE — Progress Notes (Signed)
No problems noted in the recovery room. maw 

## 2021-09-28 NOTE — Progress Notes (Signed)
Vitals-CW  Pt's states no medical or surgical changes since previsit or office visit. 

## 2021-09-28 NOTE — Progress Notes (Signed)
A/ox3, pleased with MAC, report to RN 

## 2021-09-28 NOTE — Progress Notes (Signed)
GASTROENTEROLOGY PROCEDURE H&P NOTE   Primary Care Physician: Hayden Rasmussen, MD    Reason for Procedure:  Personal history of adenomatous colon polyps and family history of colon cancer in first-degree relative less than 44 years old  Plan:    Colonoscopy  Patient is appropriate for endoscopic procedure(s) in the ambulatory ([place]) setting.  The nature of the procedure, as well as the risks, benefits, and alternatives were carefully and thoroughly reviewed with the patient. Ample time for discussion and questions allowed. The patient understood, was satisfied, and agreed to proceed.     HPI: Amber Frank is a 46 y.o. female who presents for colonoscopy.  Medical history as below.  Tolerated the prep.  No recent chest pain or shortness of breath.  No abdominal pain today.  Past Medical History:  Diagnosis Date   Allergy    SEASONAL   Anxiety    Arthritis    HANDS,KNECK   Hypertension    IBS (irritable bowel syndrome)    with diarrhea   Vitamin B12 deficiency    Vitamin D deficiency    Vitamin K deficiency     Past Surgical History:  Procedure Laterality Date   INGUINAL HERNIA REPAIR  1984   MIDDLE EAR SURGERY Bilateral    1983   PYLOROPLASTY      Prior to Admission medications   Medication Sig Start Date End Date Taking? Authorizing Provider  escitalopram (LEXAPRO) 10 MG tablet Take 10 mg by mouth daily. 10-15 mg every day   Yes [provider]  lisinopril-hydrochlorothiazide (PRINZIDE,ZESTORETIC) 10-12.5 MG tablet Take 1 tablet by mouth daily.   Yes [provider]  Thiamine HCl (VITAMIN B1 PO) Take by mouth daily. TAKE ONE TABLET   Yes [provider]  ALPRAZolam (XANAX) 0.5 MG tablet as needed. 05/13/16   [provider]  Cholecalciferol (VITAMIN D3 PO) Take by mouth daily. TAKE 3000 IU DAILY    [provider]  EPINEPHrine 0.3 mg/0.3 mL IJ SOAJ injection as needed. Patient not taking: Reported on 09/14/2021  02/25/14   [provider]  mometasone (NASONEX) 50 MCG/ACT nasal spray Place 2 sprays into the nose as needed.    [provider]  olopatadine (PATANOL) 0.1 % ophthalmic solution as needed. Patient not taking: Reported on 09/14/2021 02/04/14   [provider]    Current Outpatient Medications  Medication Sig Dispense Refill   escitalopram (LEXAPRO) 10 MG tablet Take 10 mg by mouth daily. 10-15 mg every day     lisinopril-hydrochlorothiazide (PRINZIDE,ZESTORETIC) 10-12.5 MG tablet Take 1 tablet by mouth daily.     Thiamine HCl (VITAMIN B1 PO) Take by mouth daily. TAKE ONE TABLET     ALPRAZolam (XANAX) 0.5 MG tablet as needed.     Cholecalciferol (VITAMIN D3 PO) Take by mouth daily. TAKE 3000 IU DAILY     EPINEPHrine 0.3 mg/0.3 mL IJ SOAJ injection as needed. (Patient not taking: Reported on 09/14/2021)     mometasone (NASONEX) 50 MCG/ACT nasal spray Place 2 sprays into the nose as needed.     olopatadine (PATANOL) 0.1 % ophthalmic solution as needed. (Patient not taking: Reported on 09/14/2021)     Current Facility-Administered Medications  Medication Dose Route Frequency Provider Last Rate Last Admin   0.9 %  sodium chloride infusion  500 mL Intravenous Once Reinhardt Licausi, Lajuan Lines, MD       0.9 %  sodium chloride infusion  500 mL Intravenous Once Necha Harries, Lajuan Lines, MD  Allergies as of 09/28/2021 - Review Complete 09/28/2021  Allergen Reaction Noted   Erythromycin Nausea And Vomiting 05/26/2012   Other  07/08/2016   Zyrtec [cetirizine] Other (See Comments) 07/08/2016   Penicillins Rash 05/26/2012    Family History  Problem Relation Age of Onset   Uterine cancer Mother    Deep vein thrombosis Mother    Irritable bowel syndrome Mother    Breast cancer Mother    Deep vein thrombosis Father    Colon cancer Father 23   Crohn's disease Maternal Aunt    Diverticulitis Paternal Grandmother    Crohn's disease Cousin        mat side   Esophageal cancer Neg Hx     Rectal cancer Neg Hx    Stomach cancer Neg Hx     Social History   Socioeconomic History   Marital status: Married    Spouse name: Not on file   Number of children: 3   Years of education: Not on file   Highest education level: Not on file  Occupational History   Occupation: special ed program specialist  Tobacco Use   Smoking status: Never   Smokeless tobacco: Never  Vaping Use   Vaping Use: Never used  Substance and Sexual Activity   Alcohol use: Yes    Comment: social   Drug use: No   Sexual activity: Not on file  Other Topics Concern   Not on file  Social History Narrative   Not on file   Social Determinants of Health   Financial Resource Strain: Not on file  Food Insecurity: Not on file  Transportation Needs: Not on file  Physical Activity: Not on file  Stress: Not on file  Social Connections: Not on file  Intimate Partner Violence: Not on file    Physical Exam: Vital signs in last 24 hours: @BP  (!) 130/96 (BP Location: Right Arm, Patient Position: Sitting, Cuff Size: Normal)    Pulse 78    Temp (!) 97.5 F (36.4 C) (Temporal)    Ht 5\' 5"  (1.651 m)    Wt 180 lb (81.6 kg)    LMP 09/07/2021    SpO2 99%    BMI 29.95 kg/m  GEN: NAD EYE: Sclerae anicteric ENT: MMM CV: Non-tachycardic Pulm: CTA b/l GI: Soft, NT/ND NEURO:  Alert & Oriented x 3   Zenovia Jarred, MD [facility]  09/28/2021 9:16 AM

## 2021-09-28 NOTE — Op Note (Signed)
[facility] Patient Name: Amber Frank Procedure Date: 09/28/2021 9:18 AM MRN: [id number] Endoscopist: Jerene Bears , MD Age: 46 Referring MD:  Date of Birth: Oct 14, 1975 Gender: Female Account #: [id number] Procedure:                Colonoscopy Indications:              High risk colon cancer surveillance: Personal                            history of non-advanced adenoma, Family history of                            colon cancer in a first-degree relative before age                            45 years, Last colonoscopy: November 2017 Medicines:                Monitored Anesthesia Care Procedure:                Pre-Anesthesia Assessment:                           - Prior to the procedure, a History and Physical                            was performed, and patient medications and                            allergies were reviewed. The patient's tolerance of                            previous anesthesia was also reviewed. The risks                            and benefits of the procedure and the sedation                            options and risks were discussed with the patient.                            All questions were answered, and informed consent                            was obtained. Prior Anticoagulants: The patient has                            taken no previous anticoagulant or antiplatelet                            agents. ASA Grade Assessment: II - A patient with                            mild systemic disease. After reviewing the risks  and benefits, the patient was deemed in                            satisfactory condition to undergo the procedure.                           After obtaining informed consent, the colonoscope                            was passed under direct vision. Throughout the                            procedure, the patient's blood pressure, pulse, and                            oxygen saturations were  monitored continuously. The                            Olympus PCF-H190DL (#[id number]) Colonoscope was                            introduced through the anus and advanced to the                            cecum, identified by appendiceal orifice and                            ileocecal valve. The colonoscopy was performed                            without difficulty. The patient tolerated the                            procedure well. The quality of the bowel                            preparation was good. The ileocecal valve,                            appendiceal orifice, and rectum were photographed. Scope In: 9:23:51 AM Scope Out: 9:33:48 AM Scope Withdrawal Time: 0 hours 7 minutes 1 second  Total Procedure Duration: 0 hours 9 minutes 57 seconds  Findings:                 The digital rectal exam was normal.                           A 5 mm polyp was found in the sigmoid colon. The                            polyp was sessile. The polyp was removed with a                            cold snare. Resection and retrieval were complete.  The exam was otherwise without abnormality on                            direct and retroflexion views. Complications:            No immediate complications. Estimated Blood Loss:     Estimated blood loss was minimal. Impression:               - One 5 mm polyp in the sigmoid colon, removed with                            a cold snare. Resected and retrieved.                           - The examination was otherwise normal on direct                            and retroflexion views. Recommendation:           - Patient has a contact number available for                            emergencies. The signs and symptoms of potential                            delayed complications were discussed with the                            patient. Return to normal activities tomorrow.                            Written discharge instructions were  provided to the                            patient.                           - Resume previous diet.                           - Continue present medications.                           - Await pathology results.                           - Repeat colonoscopy in 5 years for surveillance. Jerene Bears, MD 09/28/2021 9:35:36 AM This report has been signed electronically.

## 2021-09-28 NOTE — Patient Instructions (Addendum)
Handout was given to your care partner on polyps. You may resume your current medications today. Await biopsy results.  May take 1-3 weeks to receive pathology results. Repeat colonoscopy in 5 years for surveillance. Please call if any questions or concerns.      YOU HAD AN ENDOSCOPIC PROCEDURE TODAY AT [facility]:   Refer to the procedure report that was given to you for any specific questions about what was found during the examination.  If the procedure report does not answer your questions, please call your gastroenterologist to clarify.  If you requested that your care partner not be given the details of your procedure findings, then the procedure report has been included in a sealed envelope for you to review at your convenience later.  YOU SHOULD EXPECT: Some feelings of bloating in the abdomen. Passage of more gas than usual.  Walking can help get rid of the air that was put into your GI tract during the procedure and reduce the bloating. If you had a lower endoscopy (such as a colonoscopy or flexible sigmoidoscopy) you may notice spotting of blood in your stool or on the toilet paper. If you underwent a bowel prep for your procedure, you may not have a normal bowel movement for a few days.  Please Note:  You might notice some irritation and congestion in your nose or some drainage.  This is from the oxygen used during your procedure.  There is no need for concern and it should clear up in a day or so.  SYMPTOMS TO REPORT IMMEDIATELY:  Following lower endoscopy (colonoscopy or flexible sigmoidoscopy):  Excessive amounts of blood in the stool  Significant tenderness or worsening of abdominal pains  Swelling of the abdomen that is new, acute  Fever of 100F or higher   For urgent or emergent issues, a gastroenterologist can be reached at any hour by calling [phone]. Do not use MyChart messaging for urgent concerns.    DIET:  We do recommend a small meal at  first, but then you may proceed to your regular diet.  Drink plenty of fluids but you should avoid alcoholic beverages for 24 hours.  ACTIVITY:  You should plan to take it easy for the rest of today and you should NOT DRIVE or use heavy machinery until tomorrow (because of the sedation medicines used during the test).    FOLLOW UP: Our staff will call the number listed on your records 48-72 hours following your procedure to check on you and address any questions or concerns that you may have regarding the information given to you following your procedure. If we do not reach you, we will leave a message.  We will attempt to reach you two times.  During this call, we will ask if you have developed any symptoms of COVID 19. If you develop any symptoms (ie: fever, flu-like symptoms, shortness of breath, cough etc.) before then, please call [phone].  If you test positive for Covid 19 in the 2 weeks post procedure, please call and report this information to [place].    If any biopsies were taken you will be contacted by phone or by letter within the next 1-3 weeks.  Please call us at [phone] if you have not heard about the biopsies in 3 weeks.    SIGNATURES/CONFIDENTIALITY: You and/or your care partner have signed paperwork which will be entered into your electronic medical record.  These signatures attest to the fact that that the information  above on your After Visit Summary has been reviewed and is understood.  Full responsibility of the confidentiality of this discharge information lies with you and/or your care-partner.

## 2021-09-28 NOTE — Progress Notes (Signed)
Called to room to assist during endoscopic procedure.  Patient ID and intended procedure confirmed with present staff. Received instructions for my participation in the procedure from the performing physician.  

## 2021-10-02 ENCOUNTER — Telehealth: Payer: Self-pay

## 2021-10-02 ENCOUNTER — Encounter: Payer: Self-pay | Admitting: Internal Medicine

## 2021-10-02 NOTE — Telephone Encounter (Signed)
°  Follow up Call-  Call back number 09/28/2021  Post procedure Call Back phone  # [phone]  Permission to leave phone message Yes  Some recent data might be hidden     Patient questions:  Do you have a fever, pain , or abdominal swelling? No. Pain Score  0 *  Have you tolerated food without any problems? Yes.    Have you been able to return to your normal activities? Yes.    Do you have any questions about your discharge instructions: Diet   No. Medications  No. Follow up visit  No.  Do you have questions or concerns about your Care? No.  Actions: * If pain score is 4 or above: No action needed, pain <4.

## 2021-10-22 ENCOUNTER — Ambulatory Visit (HOSPITAL_COMMUNITY): Payer: BC Managed Care – PPO

## 2022-01-15 IMAGING — MG MM DIGITAL SCREENING BILAT W/ TOMO AND CAD
8 series · 8 of 24 positions shown · non-contrast
Comparison: Previous exam(s).

CLINICAL DATA: Screening.

EXAM:
DIGITAL SCREENING BILATERAL MAMMOGRAM WITH TOMOSYNTHESIS AND CAD
TECHNIQUE: Bilateral screening digital craniocaudal and mediolateral oblique
mammograms were obtained. Bilateral screening digital breast
tomosynthesis was performed. The images were evaluated with
computer-aided detection.

[R CC synth-2D]
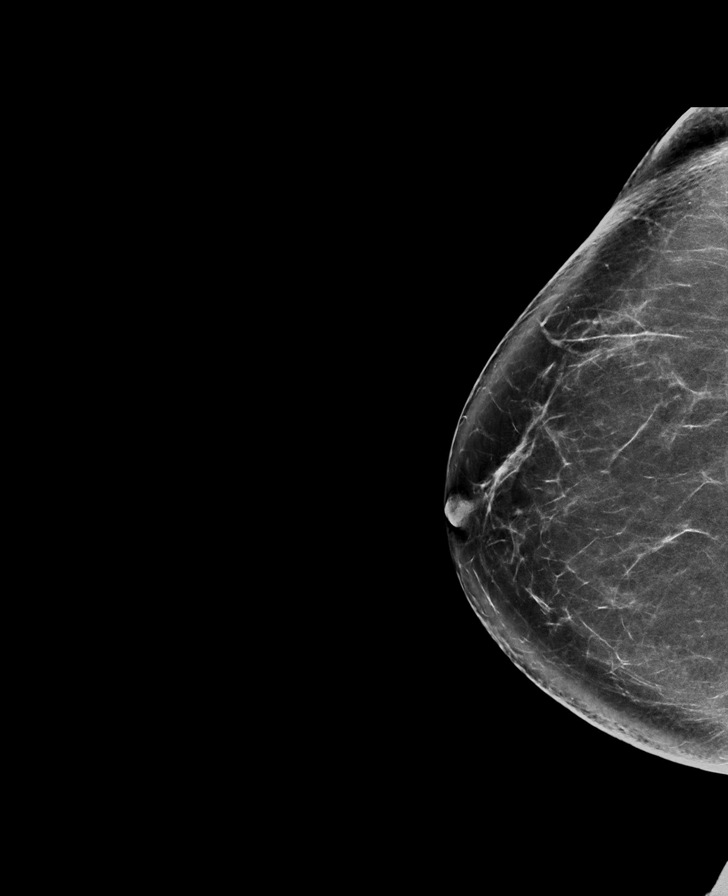

[L MLO synth-2D]
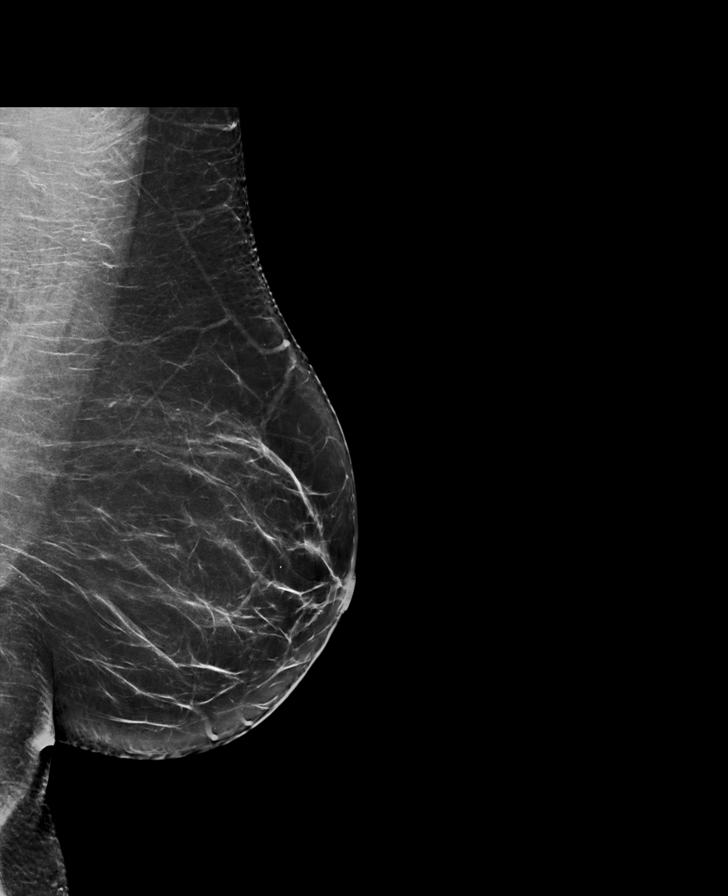

[R MLO synth-2D]
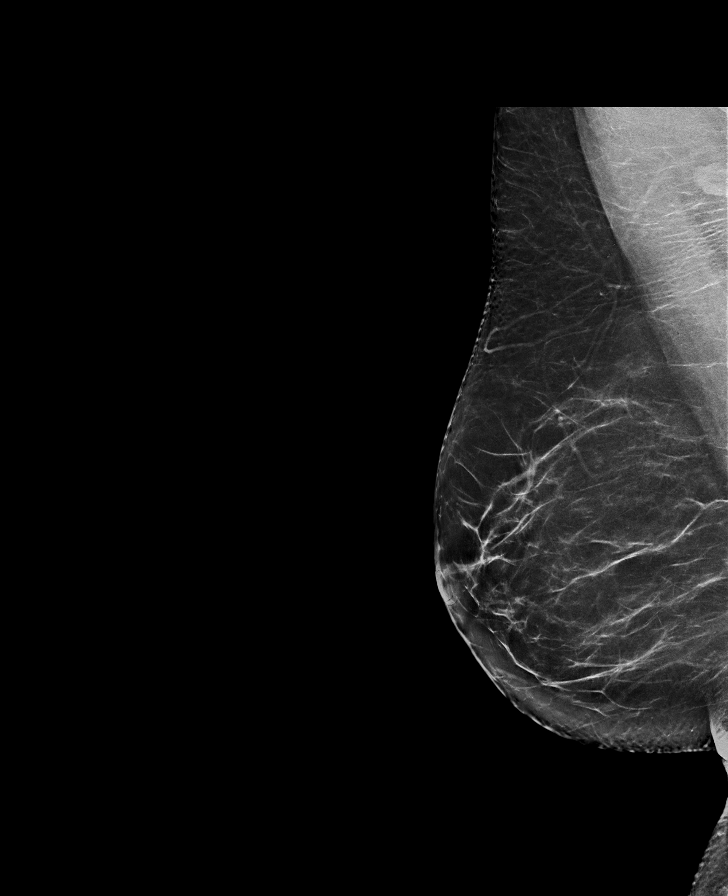

[L CC synth-2D]
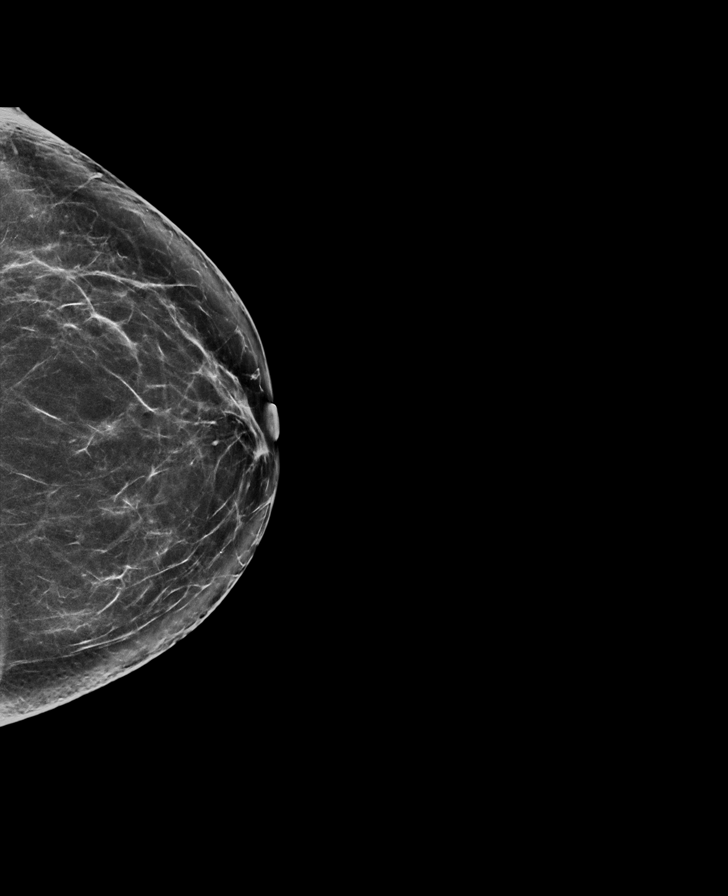

[R MLO tomo · tomo slice 43/86.0]
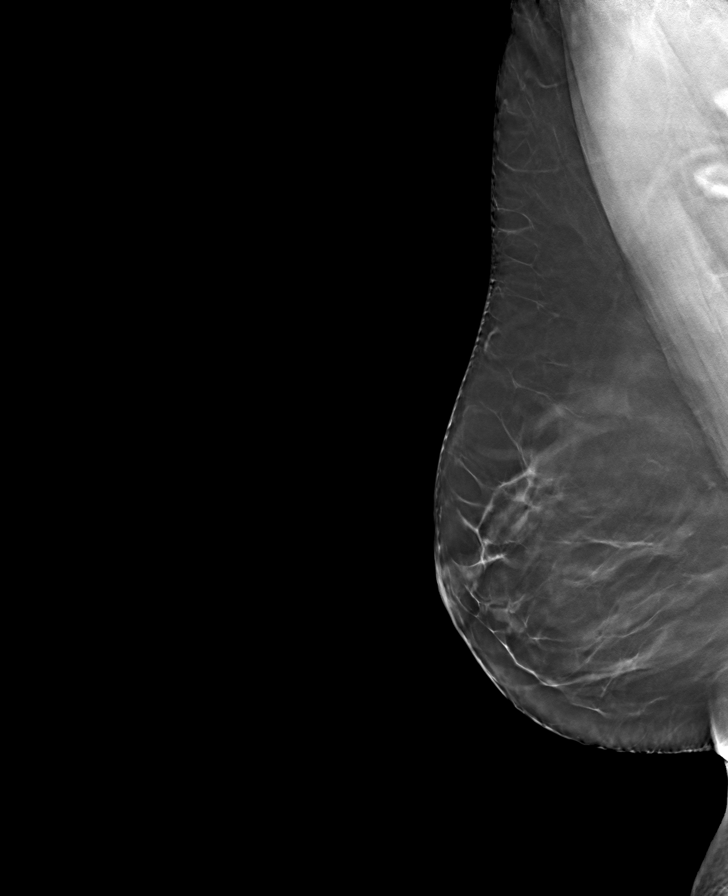

[R CC tomo · tomo slice 43/86.0]
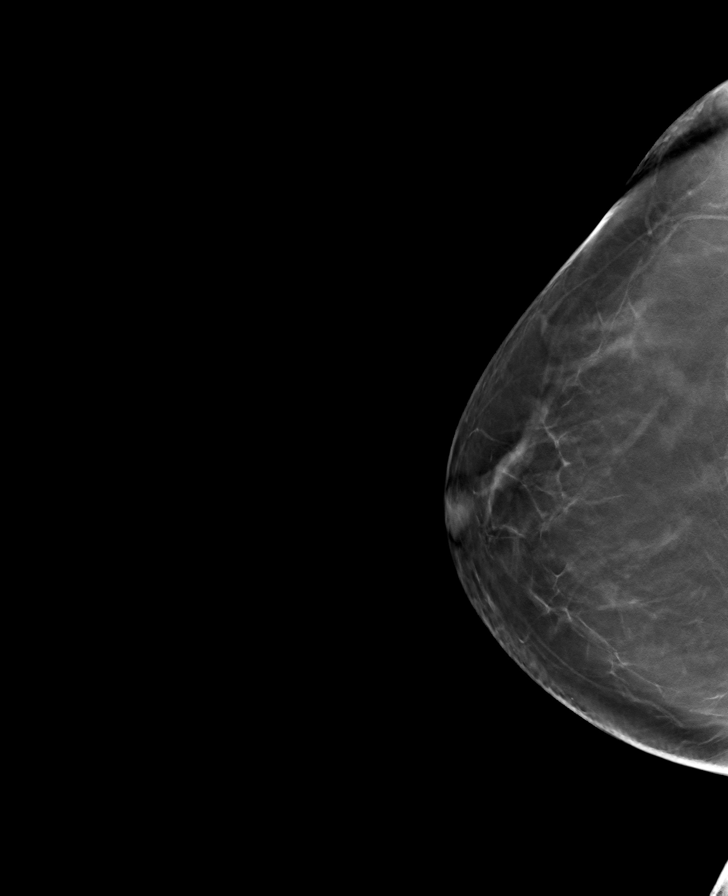

[L MLO tomo · tomo slice 43/84.0]
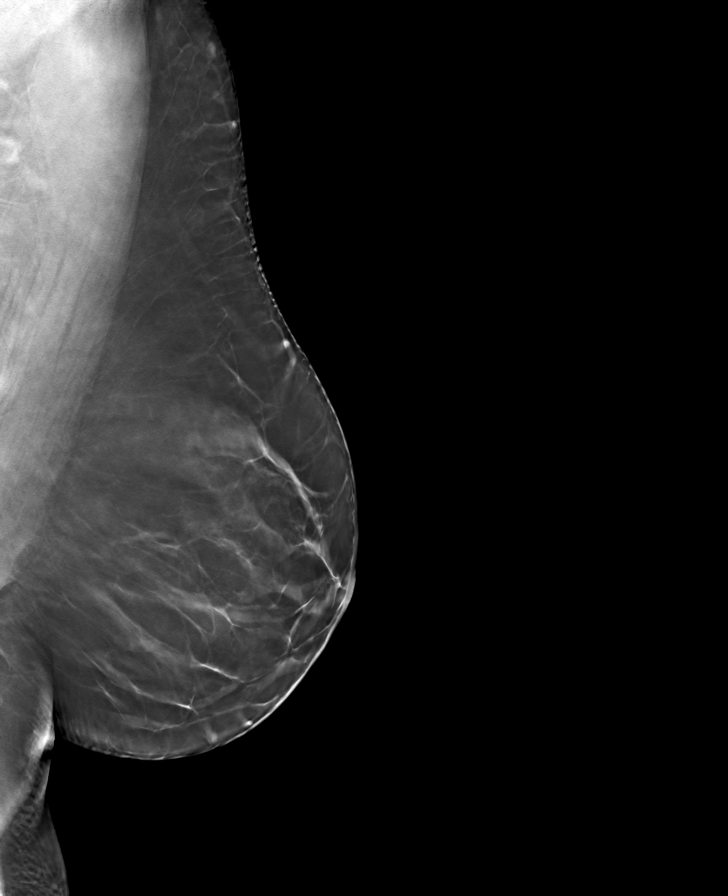

[L CC tomo · tomo slice 38/75.0]
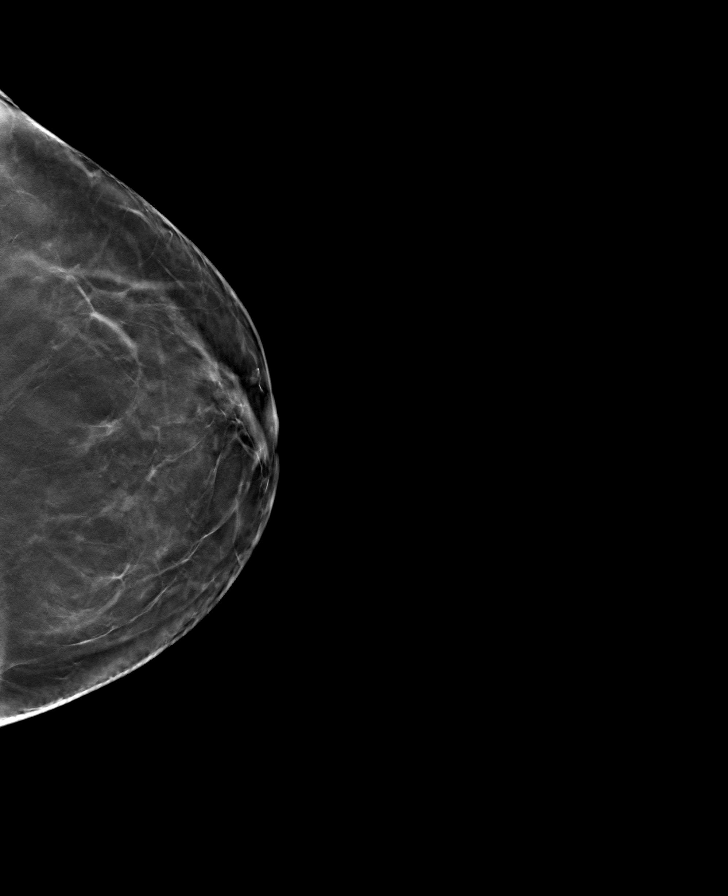

[8 of 24 positions shown; findings below may reference images not displayed]

ACR Breast Density Category b: There are scattered areas of
fibroglandular density.
FINDINGS: There are no findings suspicious for malignancy. The images were
evaluated with computer-aided detection.
IMPRESSION: No mammographic evidence of malignancy. A result letter of this
screening mammogram will be mailed directly to the patient.

RECOMMENDATION:
Screening mammogram in one year. (Code:WJ-[id number])

BI-RADS CATEGORY  1: Negative.
